# Patient Record
Sex: Male | Born: 1995 | Race: White | Hispanic: No | Marital: Single | Smoking: Current every day smoker
Health system: Southern US, Community
[De-identification: ages and names within clinical notes are randomized; demographics above are authoritative.]

## PROBLEM LIST (undated history)

## (undated) HISTORY — PX: HAND / FINGER TENDON LESION EXCISION: SUR534

---
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
## 2017-07-06 IMAGING — CT CT CERVICAL SPINE W/O CM
3 of 5 series · 12 of 33 positions shown, 14 images · non-contrast
Comparison: None.

CLINICAL DATA: Put right hand through car window, and fell, hitting
head on concrete curb. Initial encounter.

EXAM:
CT HEAD WITHOUT CONTRAST
CT CERVICAL SPINE WITHOUT CONTRAST
TECHNIQUE: Multidetector CT imaging of the head and cervical spine was
performed following the standard protocol without intravenous
contrast. Multiplanar CT image reconstructions of the cervical spine
were also generated.

[Series 6: sagittal bone · sagittal · 0.28mm/px · 5 of 46 slices shown, 6 images]
[im 16/46  bone]
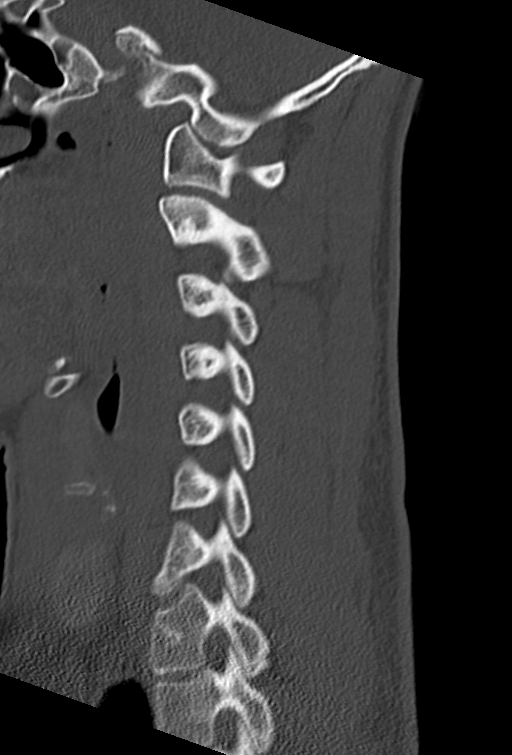
[im 19/46  bone]
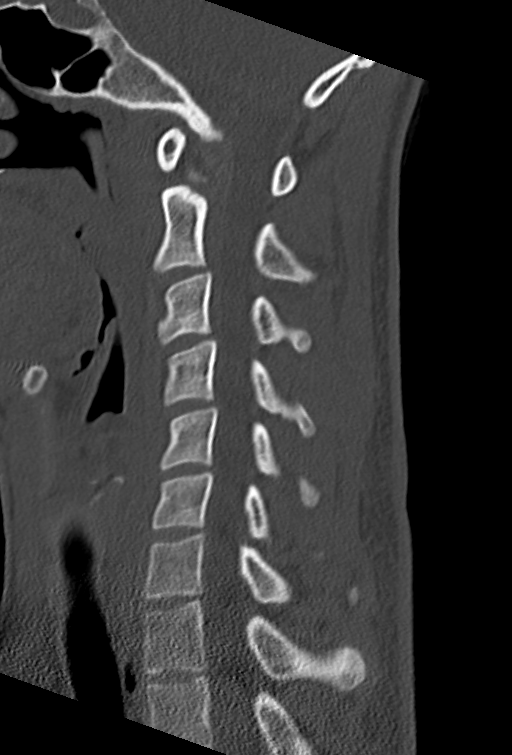
[im 23/46  soft-tissue]
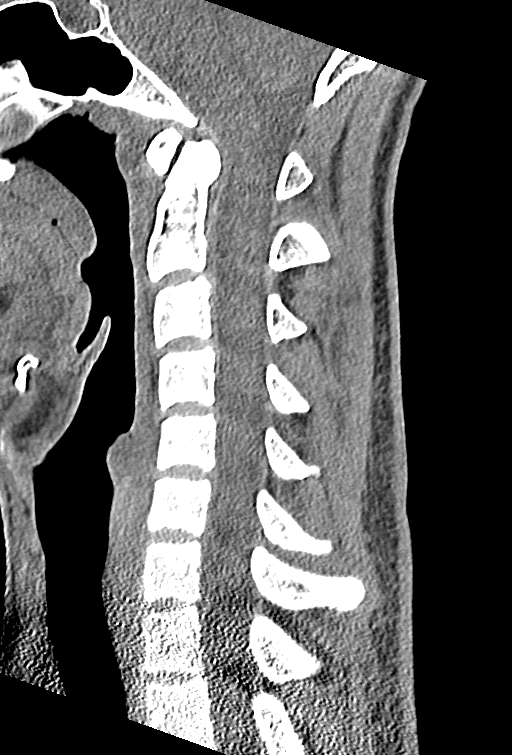
[im 23/46  bone]
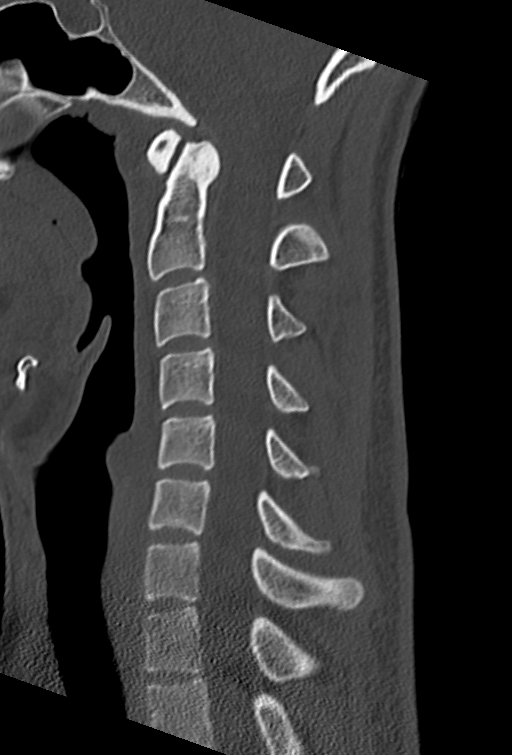
[im 27/46  bone]
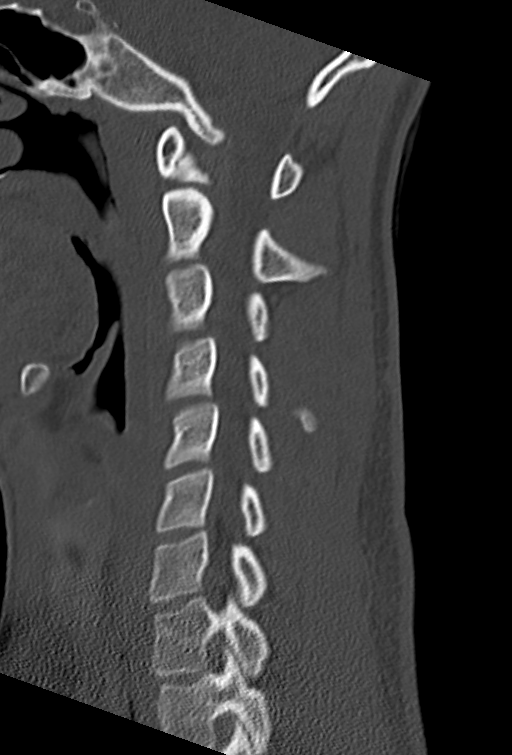
[im 31/46  bone]
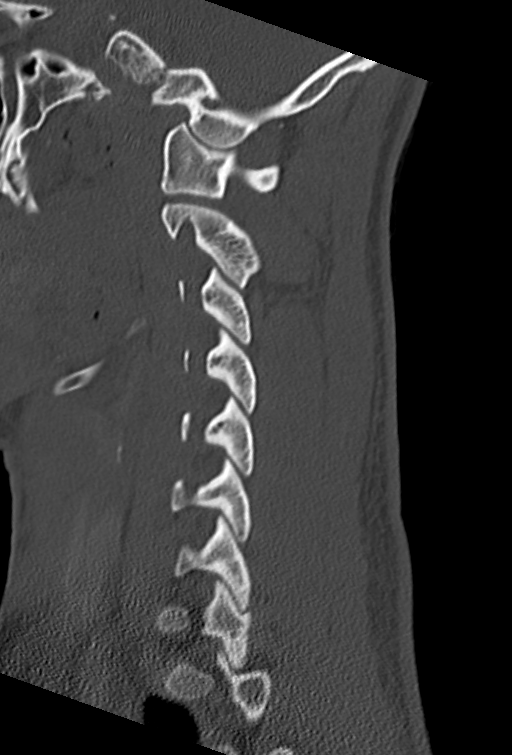

[Series 7: coronal bone · coronal · 0.29mm/px · 3 of 39 slices shown]
[im 8/39  bone]
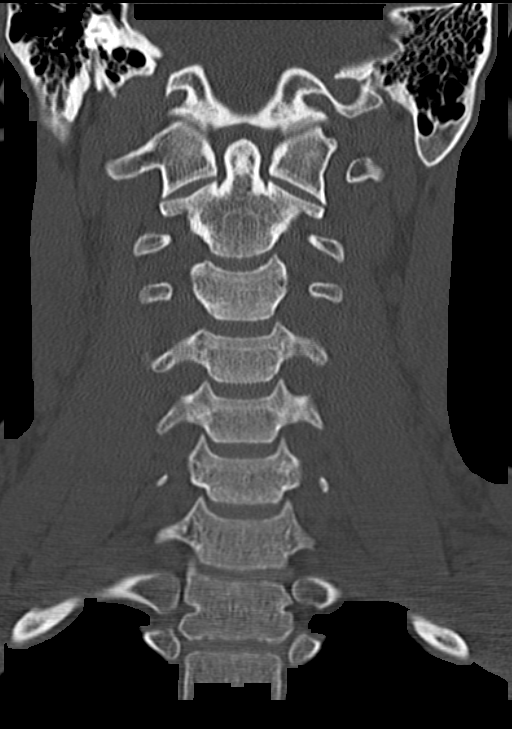
[im 16/39  bone]
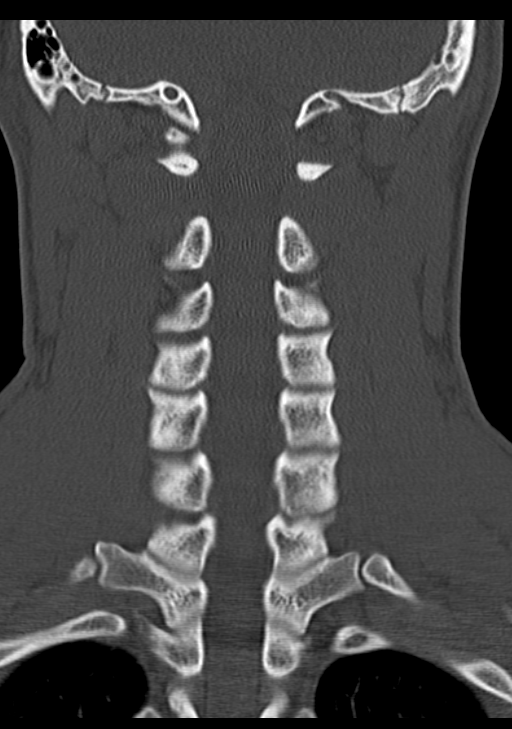
[im 23/39  bone]
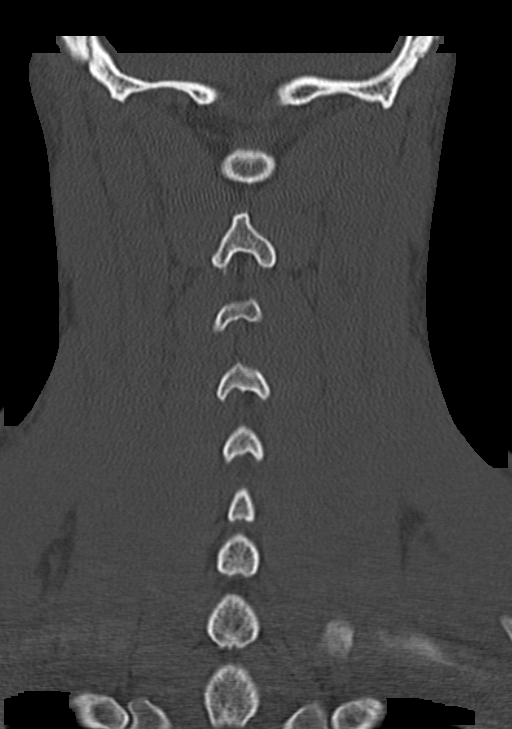

[Series 8: axial · axial · 0.25mm/px · z∈[+537,+649]mm · 4 of 99 slices shown, 5 images]
[im 20/99  soft-tissue]
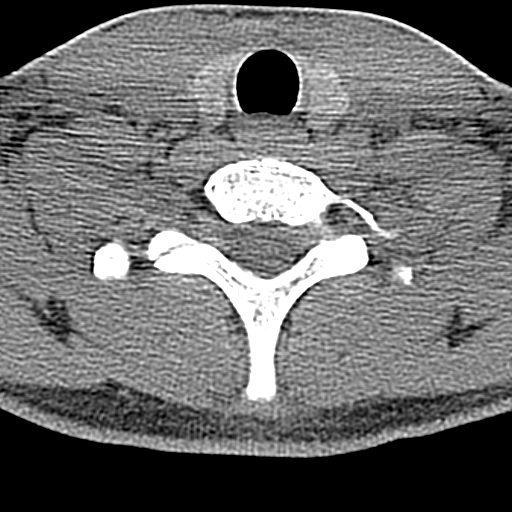
[im 20/99  bone]
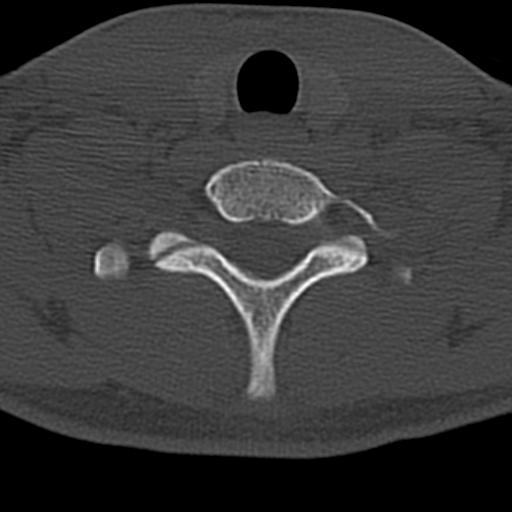
[im 40/99  bone]
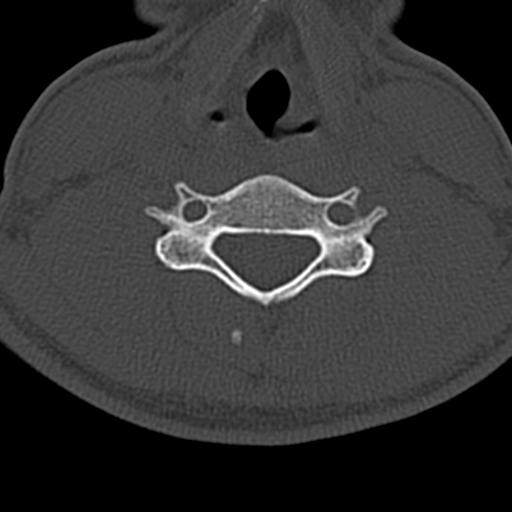
[im 59/99  bone]
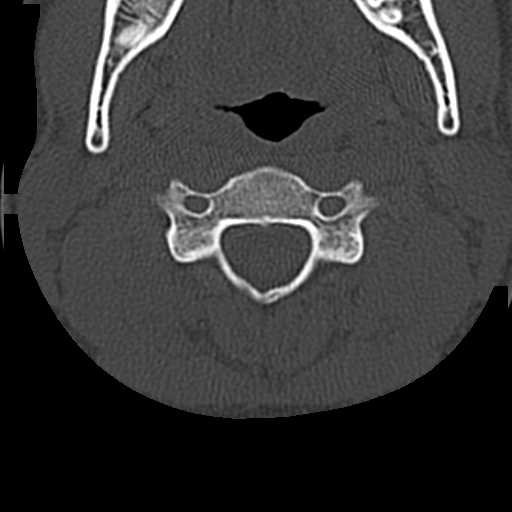
[im 79/99  bone]
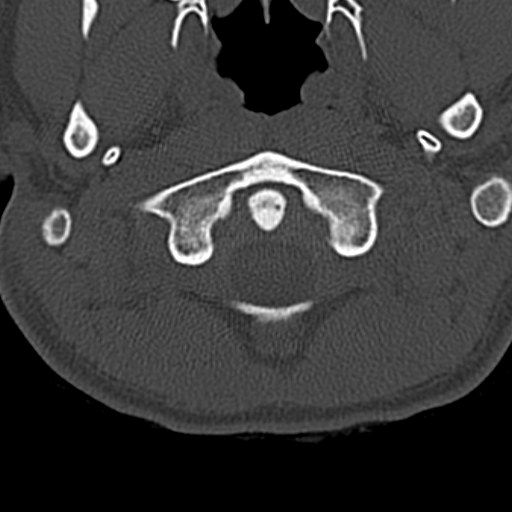

[12 of 33 positions shown; findings below may reference images not displayed]

FINDINGS: CT HEAD FINDINGS

There is no evidence of acute infarction, mass lesion, or intra- or
extra-axial hemorrhage on CT.

The posterior fossa, including the cerebellum, brainstem and fourth
ventricle, is within normal limits. The third and lateral
ventricles, and basal ganglia are unremarkable in appearance. The
cerebral hemispheres are symmetric in appearance, with normal
gray-white differentiation. No mass effect or midline shift is seen.

There is no evidence of fracture; visualized osseous structures are
unremarkable in appearance. The visualized portions of the orbits
are within normal limits. The paranasal sinuses and mastoid air
cells are well-aerated. No significant soft tissue abnormalities are
seen.

CT CERVICAL SPINE FINDINGS

There is no evidence of fracture or subluxation. Vertebral bodies
demonstrate normal height and alignment. Intervertebral disc spaces
are preserved. Prevertebral soft tissues are within normal limits.
The visualized neural foramina are grossly unremarkable.

The thyroid gland is unremarkable in appearance. The visualized lung
apices are clear. No significant soft tissue abnormalities are seen.
IMPRESSION: 1. No evidence of traumatic intracranial injury or fracture.
2. No evidence of fracture or subluxation along the cervical spine.
